# Patient Record
Sex: Male | Born: 2000 | Hispanic: Yes | Marital: Single | Smoking: Never smoker
Health system: Southern US, Community
[De-identification: ages and names within clinical notes are randomized; demographics above are authoritative.]

## PROBLEM LIST (undated history)

## (undated) DIAGNOSIS — R040 Epistaxis: Secondary | ICD-10-CM

## (undated) HISTORY — DX: Epistaxis: R04.0

---
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
## 2019-03-22 NOTE — Progress Notes (Signed)
Adolescent Well Care Visit Curtis Frost is a 18 y.o. male who is here for well care.    PCP:  Madelin HeadingsPanosh, Sarahi Borland K, MD   History was provided by the patient.  Current Issues: Current concerns include .   Non immuniz  Document or college  Asks a bout premature graying in family  Nutrition: Nutrition/Eating Behaviors:   Adequate calcium in diet?:poss not  Supplements/ Vitamins: no  Exercise/ Media: Play any Sports?/ Exercise: soccer  Screen Time:  Media Rules o 5 hours  r Monitoring?: yes   Sleep:  Sleep:7   Social Screening: Lives with:  Mom  Father in  immigration limbo  Still  Parental relations:  good Activities, Work, and Regulatory affairs officerChores?:  Concerns regarding behavior with peers?  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   Confidential Social History: Tobacco?  no tobacco  Secondhand smoke exposure?  no no Drugs/ETOH?  no  Safe at home, in school & in relationships?  Yes Safe to self?  Yes   Screenings: Patient has a dental home: yes  PHQ-  No depression per patient  Physical Exam:  Vitals:   03/25/19 1454  BP: 118/68  Pulse: 91  Temp: 98.1 F (36.7 C)  TempSrc: Oral  SpO2: 95%  Weight: 126 lb 3.2 oz (57.2 kg)  Height: 5' 4.5" (1.638 m)   BP 118/68 (BP Location: Left Arm, Patient Position: Sitting, Cuff Size: Normal)   Pulse 91   Temp 98.1 F (36.7 C) (Oral)   Ht 5' 4.5" (1.638 m)   Wt 126 lb 3.2 oz (57.2 kg)   SpO2 95%   BMI 21.33 kg/m  Body mass index: body mass index is 21.33 kg/m. Blood pressure percentiles are not available for patients who are 18 years or older.  Physical Exam Well-developed well-nourished healthy-appearing appears stated age in no acute distress.  HEENT: Normocephalic  TMs clear  Nl lm  EACs  Eyes RR x2 EOMs appear normal  nares patent OP deferred  Masking Neck: supple without adenopathy Chest :clear to auscultation breath sounds equal no wheezes rales or rhonchi Cardiovascular :PMI nondisplaced S1-S2 no gallops or murmurs peripheral pulses present without delay Abdomen :soft without organomegaly guarding or rebound Lymph nodes :no significant adenopathy neck axillary inguinal Extremities: no acute deformities normal range of motion no acute swelling Gait within normal limits Spine without scoliosis Neurologic: grossly nonfocal normal tone cranial nerves appear intact. Skin: no acute rashes Screening ortho / MS exam: normal;  No scoliosis ,LOM , joint swelling or gait disturbance . Muscle mass is normal .   Lab Results  Component Value Date   WBC 6.1 03/14/2018   HGB 15.9 03/14/2018   HCT 45.7 03/14/2018   PLT 234.0 03/14/2018   GLUCOSE 88 03/14/2018   CHOL 167 03/14/2018   TRIG 158.0 (H) 03/14/2018   HDL 39.10 03/14/2018   LDLCALC 97 03/14/2018   ALT 21 03/14/2018   AST 19 03/14/2018   NA 140 03/14/2018   K 4.4 03/14/2018   CL 102 03/14/2018   CREATININE 1.07 03/14/2018   BUN 10 03/14/2018   CO2 31 03/14/2018   TSH 1.80 03/14/2018     Assessment and Plan:     ICD-10-CM   1. Visit for preventive health examination  Z00.00 Meningococcal MCV4O(Menveo)  2. Need for meningococcal vaccination  Z23 Meningococcal B, OMV   BMI is appropriate for age  Hearing screening result:not examined Vision screening result: not examined reviewed  Growth  parameters Counseling provided for all of the vaccine components  Orders Placed This Encounter  Procedures  . Meningococcal MCV4O(Menveo)  . Meningococcal B, OMV   Return in about 1 year (around 03/24/2020) for cpx .Marland Kitchen  Shanon Ace, MD

## 2019-03-25 ENCOUNTER — Encounter: Payer: Self-pay | Admitting: Internal Medicine

## 2019-03-25 ENCOUNTER — Other Ambulatory Visit: Payer: Self-pay

## 2019-03-25 ENCOUNTER — Ambulatory Visit (INDEPENDENT_AMBULATORY_CARE_PROVIDER_SITE_OTHER): Payer: Managed Care, Other (non HMO) | Admitting: Internal Medicine

## 2019-03-25 VITALS — BP 118/68 | HR 91 | Temp 98.1°F | Ht 64.5 in | Wt 126.2 lb

## 2019-03-25 DIAGNOSIS — Z23 Encounter for immunization: Secondary | ICD-10-CM | POA: Diagnosis not present

## 2019-03-25 DIAGNOSIS — Z0000 Encounter for general adult medical examination without abnormal findings: Secondary | ICD-10-CM | POA: Diagnosis not present

## 2019-03-25 NOTE — Patient Instructions (Signed)
Good luck in college.  Getting menveo 2 and meningitis B 2 today   Advise yearly flu vaccine  Next tdap due in 2023    Health Maintenance, Male Adopting a healthy lifestyle and getting preventive care are important in promoting health and wellness. Ask your health care provider about:  The right schedule for you to have regular tests and exams.  Things you can do on your own to prevent diseases and keep yourself healthy. What should I know about diet, weight, and exercise? Eat a healthy diet   Eat a diet that includes plenty of vegetables, fruits, low-fat dairy products, and lean protein.  Do not eat a lot of foods that are high in solid fats, added sugars, or sodium. Maintain a healthy weight Body mass index (BMI) is a measurement that can be used to identify possible weight problems. It estimates body fat based on height and weight. Your health care provider can help determine your BMI and help you achieve or maintain a healthy weight. Get regular exercise Get regular exercise. This is one of the most important things you can do for your health. Most adults should:  Exercise for at least 150 minutes each week. The exercise should increase your heart rate and make you sweat (moderate-intensity exercise).  Do strengthening exercises at least twice a week. This is in addition to the moderate-intensity exercise.  Spend less time sitting. Even light physical activity can be beneficial. Watch cholesterol and blood lipids Have your blood tested for lipids and cholesterol at 19 years of age, then have this test every 5 years. You may need to have your cholesterol levels checked more often if:  Your lipid or cholesterol levels are high.  You are older than 19 years of age.  You are at high risk for heart disease. What should I know about cancer screening? Many types of cancers can be detected early and may often be prevented. Depending on your health history and family history, you  may need to have cancer screening at various ages. This may include screening for:  Colorectal cancer.  Prostate cancer.  Skin cancer.  Lung cancer. What should I know about heart disease, diabetes, and high blood pressure? Blood pressure and heart disease  High blood pressure causes heart disease and increases the risk of stroke. This is more likely to develop in people who have high blood pressure readings, are of African descent, or are overweight.  Talk with your health care provider about your target blood pressure readings.  Have your blood pressure checked: ? Every 3-5 years if you are 2318-19 years of age. ? Every year if you are 19 years old or older.  If you are between the ages of 6865 and 4675 and are a current or former smoker, ask your health care provider if you should have a one-time screening for abdominal aortic aneurysm (AAA). Diabetes Have regular diabetes screenings. This checks your fasting blood sugar level. Have the screening done:  Once every three years after age 19 if you are at a normal weight and have a low risk for diabetes.  More often and at a younger age if you are overweight or have a high risk for diabetes. What should I know about preventing infection? Hepatitis B If you have a higher risk for hepatitis B, you should be screened for this virus. Talk with your health care provider to find out if you are at risk for hepatitis B infection. Hepatitis C Blood testing is recommended  for:  Everyone born from [birth year] through 1966.  Anyone with known risk factors for hepatitis C. Sexually transmitted infections (STIs)  You should be screened each year for STIs, including gonorrhea and chlamydia, if: ? You are sexually active and are younger than 19 years of age. ? You are older than 19 years of age and your health care provider tells you that you are at risk for this type of infection. ? Your sexual activity has changed since you were last screened, and you  are at increased risk for chlamydia or gonorrhea. Ask your health care provider if you are at risk.  Ask your health care provider about whether you are at high risk for HIV. Your health care provider may recommend a prescription medicine to help prevent HIV infection. If you choose to take medicine to prevent HIV, you should first get tested for HIV. You should then be tested every 3 months for as long as you are taking the medicine. Follow these instructions at home: Lifestyle  Do not use any products that contain nicotine or tobacco, such as cigarettes, e-cigarettes, and chewing tobacco. If you need help quitting, ask your health care provider.  Do not use street drugs.  Do not share needles.  Ask your health care provider for help if you need support or information about quitting drugs. Alcohol use  Do not drink alcohol if your health care provider tells you not to drink.  If you drink alcohol: ? Limit how much you have to 0-2 drinks a day. ? Be aware of how much alcohol is in your drink. In the [place] one drink equals one 12 oz bottle of beer (355 mL), one 5 oz glass of wine (148 mL), or one 1 oz glass of hard liquor (44 mL). General instructions  Schedule regular health, dental, and eye exams.  Stay current with your vaccines.  Tell your health care provider if: ? You often feel depressed. ? You have ever been abused or do not feel safe at home. Summary  Adopting a healthy lifestyle and getting preventive care are important in promoting health and wellness.  Follow your health care provider's instructions about healthy diet, exercising, and getting tested or screened for diseases.  Follow your health care provider's instructions on monitoring your cholesterol and blood pressure. This information is not intended to replace advice given to you by your health care provider. Make sure you discuss any questions you have with your health care provider. Document Released:  02/11/2008 Document Revised: 08/08/2018 Document Reviewed: 08/08/2018 Elsevier Patient Education  2020 [employer].

## 2020-02-27 NOTE — Progress Notes (Signed)
Chief Complaint  Patient presents with  . Annual Exam    Doing okay    HPI: Patient  Curtis Frost  19 y.o. comes in today for [place] visit  Going to [employer] college in the fall ( had one semester in past)  Has been owrkin on line at home   [place] a year ago  Major cyber security  commuter Exercising   Pick up soccer .  Health Maintenance  Topic Date Due  . Hepatitis C Screening  Never done  . INFLUENZA VACCINE  03/29/2020  . TETANUS/TDAP  03/30/2022  . COVID-19 Vaccine  Completed  . HIV Screening  Completed   Health Maintenance Review LIFESTYLE:  Exercise:  Yes reg Tobacco/ETS:n Alcohol: n Sugar beverages: soda  On weekends.  Sleep: about 7 hours + Drug use: no HH of  2 +  Pet s Work:/school   [employer] rising fr  No major injury   ROS:  GEN/ HEENT: No fever, significant weight changes sweats headaches vision problems hearing changes, CV/ PULM; No chest pain shortness of breath cough, syncope,edema  change in exercise tolerance. GI /GU: No adominal pain, vomiting, change in bowel habits. No blood in the stool. No significant GU symptoms. SKIN/HEME: ,no acute skin rashes suspicious lesions or bleeding. No lymphadenopathy, nodules, masses.  NEURO/ PSYCH:  No neurologic signs such as weakness numbness. No depression anxiety. IMM/ Allergy: No unusual infections.  Allergy .   REST of 12 system review negative except as per HPI   Past Medical History:  Diagnosis Date  . Epistaxis 04/20/2010   Qualifier: Diagnosis of  By: Regis Bill MD, Standley Brooking     History reviewed. No pertinent surgical history.  Family History  Problem Relation Age of Onset  . Diabetes Other        both grandparents generations  . Hyperlipidemia Mother        rx with diet    Social History   Socioeconomic History  . Marital status: Single    Spouse name: Not on file  . Number of children: Not on file  . Years of education: Not on file  . Highest  education level: Not on file  Occupational History  . Not on file  Tobacco Use  . Smoking status: Never Smoker  . Smokeless tobacco: Never Used  Vaping Use  . Vaping Use: Never used  Substance and Sexual Activity  . Alcohol use: No  . Drug use: No  . Sexual activity: Not Currently  Other Topics Concern  . Not on file  Social History Narrative   Elem  [employer] in school middle school  [employer]  To [employer]    Neg tad   Soccer.     HH of 3    No pets     Social Determinants of Radio broadcast assistant Strain:   . Difficulty of Paying Living Expenses:   Food Insecurity:   . Worried About Charity fundraiser in the Last Year:   . Arboriculturist in the Last Year:   Transportation Needs:   . Film/video editor (Medical):   Marland Kitchen Lack of Transportation (Non-Medical):   Physical Activity:   . Days of Exercise per Week:   . Minutes of Exercise per Session:   Stress:   . Feeling of Stress :   Social Connections:   . Frequency of Communication with Friends and Family:   . Frequency of  Social Gatherings with Friends and Family:   . Attends Religious Services:   . Active Member of Clubs or Organizations:   . Attends Archivist Meetings:   Marland Kitchen Marital Status:     No outpatient medications prior to visit.   No facility-administered medications prior to visit.     EXAM:  BP 110/70   Pulse 63   Temp 98.2 F (36.8 C) (Temporal)   Ht '5\' 5"'  (1.651 m)   Wt 127 lb 12.8 oz (58 kg)   SpO2 98%   BMI 21.27 kg/m   Body mass index is 21.27 kg/m. Wt Readings from Last 3 Encounters:  02/28/20 127 lb 12.8 oz (58 kg) (10 %, Z= -1.30)*  03/25/19 126 lb 3.2 oz (57.2 kg) (11 %, Z= -1.24)*  03/02/18 123 lb (55.8 kg) (12 %, Z= -1.16)*   * Growth percentiles are based on CDC (Boys, 2-20 Years) data.    Physical Exam: Vital signs reviewed DVV:OHYW is a well-developed well-nourished alert cooperative    who appearsr stated age in no acute distress.    HEENT: normocephalic atraumatic , Eyes: PERRL EOM's full, conjunctiva clear, Nares: paten,t no deformity discharge or tenderness., Ears: no deformity EAC's 1+ wax reight ear tm nl TMs with normal landmarks. Mouth: masked NECK: supple without masses, thyromegaly or bruits. CHEST/PULM:  Clear to auscultation and percussion breath sounds equal no wheeze , rales or rhonchi. No chest wall deformities or tenderness. CV: PMI is nondisplaced, S1 S2 no gallops, murmurs, rubs. Peripheral pulses are full without delay.No JVD .  ABDOMEN: Bowel sounds normal nontender  No guard or rebound, no hepato splenomegal no CVA tenderness.   Extremtities:  No clubbing cyanosis or edema, no acute joint swelling or redness no focal atrophy NEURO:  Oriented x3, cranial nerves 3-12 appear to be intact, no obvious focal weakness,gait within normal limits no abnormal reflexes or asymmetrical SKIN: No acute rashes normal turgor, color, no bruising or petechiae. PSYCH: Oriented, good eye contact, no obvious depression anxiety, cognition and judgment appear normal. LN: no cervical axillary inguinal adenopathy  Lab Results  Component Value Date   WBC 6.1 03/14/2018   HGB 15.9 03/14/2018   HCT 45.7 03/14/2018   PLT 234.0 03/14/2018   GLUCOSE 88 03/14/2018   CHOL 167 03/14/2018   TRIG 158.0 (H) 03/14/2018   HDL 39.10 03/14/2018   LDLCALC 97 03/14/2018   ALT 21 03/14/2018   AST 19 03/14/2018   NA 140 03/14/2018   K 4.4 03/14/2018   CL 102 03/14/2018   CREATININE 1.07 03/14/2018   BUN 10 03/14/2018   CO2 31 03/14/2018   TSH 1.80 03/14/2018    BP Readings from Last 3 Encounters:  02/28/20 110/70  03/25/19 118/68  03/02/18 (!) 108/64 (28 %, Z = -0.59 /  43 %, Z = -0.19)*   *BP percentiles are based on the 2017 AAP Clinical Practice Guideline for boys    Past  Lab results reviewed with patient  Slight elevation of tg    Reviewed physical parameters   ASSESSMENT AND PLAN:  Discussed the following assessment  and plan:    ICD-10-CM   1. Visit for preventive health examination  Z00.00    Return in about 1 year (around 02/27/2021) for wellness visit. Shared Decision Making  updated lab  declined sti screen no need    Agree with getting lipid panel  BG screen  etc in  About 5 years from past  Continue lifestyle intervention healthy eating and  exercise .  Immuniz utd  And copies given If requires form in next year  Ok   sugg sign up for my chart  Patient Care Team: Aslin Farinas, Standley Brooking, MD as PCP - General Patient Instructions  Glad you are well  Continue lifestyle intervention healthy eating and exercise .   Get lipid panel checked   In  In 2023 0 2024 or as needed     Preventive Care 65-34 Years Old, Male Preventive care refers to lifestyle choices and visits with your health care provider that can promote health and wellness. At this stage in your life, you may start seeing a primary care physician instead of a pediatrician. Your health care is now your responsibility. Preventive care for young adults includes:  A yearly physical exam. This is also called an annual wellness visit.  Regular dental and eye exams.  Immunizations.  Screening for certain conditions.  Healthy lifestyle choices, such as diet and exercise. What can I expect for my preventive care visit? Physical exam Your health care provider may check:  Height and weight. These may be used to calculate body mass index (BMI), which is a measurement that tells if you are at a healthy weight.  Heart rate and blood pressure.  Body temperature. Counseling Your health care provider may ask you questions about:  Past medical problems and family medical history.  Alcohol, tobacco, and drug use.  Home and relationship well-being.  Access to firearms.  Emotional well-being.  Diet, exercise, and sleep habits.  Sexual activity and sexual health. What immunizations do I need?  Influenza (flu) vaccine  This is  recommended every year. Tetanus, diphtheria, and pertussis (Tdap) vaccine  You may need a Td booster every 10 years. Varicella (chickenpox) vaccine  You may need this vaccine if you have not already been vaccinated. Human papillomavirus (HPV) vaccine  If recommended by your health care provider, you may need three doses over 6 months. Measles, mumps, and rubella (MMR) vaccine  You may need at least one dose of MMR. You may also need a second dose. Meningococcal conjugate (MenACWY) vaccine  One dose is recommended if you are 71-75 years old and a Market researcher living in a residence hall, or if you have one of several medical conditions. You may also need additional booster doses. Pneumococcal conjugate (PCV13) vaccine  You may need this if you have certain conditions and were not previously vaccinated. Pneumococcal polysaccharide (PPSV23) vaccine  You may need one or two doses if you smoke cigarettes or if you have certain conditions. Hepatitis A vaccine  You may need this if you have certain conditions or if you travel or work in places where you may be exposed to hepatitis A. Hepatitis B vaccine  You may need this if you have certain conditions or if you travel or work in places where you may be exposed to hepatitis B. Haemophilus influenzae type b (Hib) vaccine  You may need this if you have certain risk factors. You may receive vaccines as individual doses or as more than one vaccine together in one shot (combination vaccines). Talk with your health care provider about the risks and benefits of combination vaccines. What tests do I need? Blood tests  Lipid and cholesterol levels. These may be checked every 5 years starting at age 20.  Hepatitis C test.  Hepatitis B test. Screening  Genital exam to check for testicular cancer or hernias.  Sexually transmitted disease (STD) testing, if you are at risk.  Other tests  Tuberculosis skin test.  Vision and  hearing tests.  Skin exam. Follow these instructions at home: Eating and drinking   Eat a diet that includes fresh fruits and vegetables, whole grains, lean protein, and low-fat dairy products.  Drink enough fluid to keep your urine pale yellow.  Do not drink alcohol if: ? Your health care provider tells you not to drink. ? You are under the legal drinking age. In the [place] the legal drinking age is 80.  If you drink alcohol: ? Limit how much you have to 0-2 drinks a day. ? Be aware of how much alcohol is in your drink. In the [place] one drink equals one 12 oz bottle of beer (355 mL), one 5 oz glass of wine (148 mL), or one 1 oz glass of hard liquor (44 mL). Lifestyle  Take daily care of your teeth and gums.  Stay active. Exercise at least 30 minutes 5 or more days of the week.  Do not use any products that contain nicotine or tobacco, such as cigarettes, e-cigarettes, and chewing tobacco. If you need help quitting, ask your health care provider.  Do not use drugs.  If you are sexually active, practice safe sex. Use a condom or other form of protection to prevent STIs (sexually transmitted infections).  Find healthy ways to cope with stress, such as: ? Meditation, yoga, or listening to music. ? Journaling. ? Talking to a trusted person. ? Spending time with friends and family. Safety  Always wear your seat belt while driving or riding in a vehicle.  Do not drive if you have been drinking alcohol.  Do not ride with someone who has been drinking.  Do not drive when you are tired or distracted.  Do not text while driving.  Wear a helmet and other protective equipment during sports activities.  If you have firearms in your house, make sure you follow all gun safety procedures.  Seek help if you have been bullied, physically abused, or sexually abused.  Use the Internet responsibly to avoid dangers such as online bullying and online sex predators. What's next?  Go  to your health care provider once a year for a well check visit.  Ask your health care provider how often you should have your eyes and teeth checked.  Stay up to date on all vaccines. This information is not intended to replace advice given to you by your health care provider. Make sure you discuss any questions you have with your health care provider. Document Revised: 08/09/2018 Document Reviewed: 08/09/2018 Elsevier Patient Education  2020 Graham Johnnell Liou M.D.

## 2020-02-28 ENCOUNTER — Other Ambulatory Visit: Payer: Self-pay

## 2020-02-28 ENCOUNTER — Encounter: Payer: Self-pay | Admitting: Internal Medicine

## 2020-02-28 ENCOUNTER — Ambulatory Visit (INDEPENDENT_AMBULATORY_CARE_PROVIDER_SITE_OTHER): Payer: Managed Care, Other (non HMO) | Admitting: Internal Medicine

## 2020-02-28 VITALS — BP 110/70 | HR 63 | Temp 98.2°F | Ht 65.0 in | Wt 127.8 lb

## 2020-02-28 DIAGNOSIS — Z0000 Encounter for general adult medical examination without abnormal findings: Secondary | ICD-10-CM

## 2020-02-28 NOTE — Patient Instructions (Addendum)
Glad you are well  Continue lifestyle intervention healthy eating and exercise .   Get lipid panel checked   In  In 2023 0 2024 or as needed     Preventive Care 22-20 Years Old, Male Preventive care refers to lifestyle choices and visits with your health care provider that can promote health and wellness. At this stage in your life, you may start seeing a primary care physician instead of a pediatrician. Your health care is now your responsibility. Preventive care for young adults includes:  A yearly physical exam. This is also called an annual wellness visit.  Regular dental and eye exams.  Immunizations.  Screening for certain conditions.  Healthy lifestyle choices, such as diet and exercise. What can I expect for my preventive care visit? Physical exam Your health care provider may check:  Height and weight. These may be used to calculate body mass index (BMI), which is a measurement that tells if you are at a healthy weight.  Heart rate and blood pressure.  Body temperature. Counseling Your health care provider may ask you questions about:  Past medical problems and family medical history.  Alcohol, tobacco, and drug use.  Home and relationship well-being.  Access to firearms.  Emotional well-being.  Diet, exercise, and sleep habits.  Sexual activity and sexual health. What immunizations do I need?  Influenza (flu) vaccine  This is recommended every year. Tetanus, diphtheria, and pertussis (Tdap) vaccine  You may need a Td booster every 10 years. Varicella (chickenpox) vaccine  You may need this vaccine if you have not already been vaccinated. Human papillomavirus (HPV) vaccine  If recommended by your health care provider, you may need three doses over 6 months. Measles, mumps, and rubella (MMR) vaccine  You may need at least one dose of MMR. You may also need a second dose. Meningococcal conjugate (MenACWY) vaccine  One dose is recommended if you  are 52-20 years old and a Market researcher living in a residence hall, or if you have one of several medical conditions. You may also need additional booster doses. Pneumococcal conjugate (PCV13) vaccine  You may need this if you have certain conditions and were not previously vaccinated. Pneumococcal polysaccharide (PPSV23) vaccine  You may need one or two doses if you smoke cigarettes or if you have certain conditions. Hepatitis A vaccine  You may need this if you have certain conditions or if you travel or work in places where you may be exposed to hepatitis A. Hepatitis B vaccine  You may need this if you have certain conditions or if you travel or work in places where you may be exposed to hepatitis B. Haemophilus influenzae type b (Hib) vaccine  You may need this if you have certain risk factors. You may receive vaccines as individual doses or as more than one vaccine together in one shot (combination vaccines). Talk with your health care provider about the risks and benefits of combination vaccines. What tests do I need? Blood tests  Lipid and cholesterol levels. These may be checked every 5 years starting at age 94.  Hepatitis C test.  Hepatitis B test. Screening  Genital exam to check for testicular cancer or hernias.  Sexually transmitted disease (STD) testing, if you are at risk. Other tests  Tuberculosis skin test.  Vision and hearing tests.  Skin exam. Follow these instructions at home: Eating and drinking   Eat a diet that includes fresh fruits and vegetables, whole grains, lean protein, and low-fat dairy  products.  Drink enough fluid to keep your urine pale yellow.  Do not drink alcohol if: ? Your health care provider tells you not to drink. ? You are under the legal drinking age. In the [place] the legal drinking age is 40.  If you drink alcohol: ? Limit how much you have to 0-2 drinks a day. ? Be aware of how much alcohol is in your drink.  In the [place] one drink equals one 12 oz bottle of beer (355 mL), one 5 oz glass of wine (148 mL), or one 1 oz glass of hard liquor (44 mL). Lifestyle  Take daily care of your teeth and gums.  Stay active. Exercise at least 30 minutes 5 or more days of the week.  Do not use any products that contain nicotine or tobacco, such as cigarettes, e-cigarettes, and chewing tobacco. If you need help quitting, ask your health care provider.  Do not use drugs.  If you are sexually active, practice safe sex. Use a condom or other form of protection to prevent STIs (sexually transmitted infections).  Find healthy ways to cope with stress, such as: ? Meditation, yoga, or listening to music. ? Journaling. ? Talking to a trusted person. ? Spending time with friends and family. Safety  Always wear your seat belt while driving or riding in a vehicle.  Do not drive if you have been drinking alcohol.  Do not ride with someone who has been drinking.  Do not drive when you are tired or distracted.  Do not text while driving.  Wear a helmet and other protective equipment during sports activities.  If you have firearms in your house, make sure you follow all gun safety procedures.  Seek help if you have been bullied, physically abused, or sexually abused.  Use the Internet responsibly to avoid dangers such as online bullying and online sex predators. What's next?  Go to your health care provider once a year for a well check visit.  Ask your health care provider how often you should have your eyes and teeth checked.  Stay up to date on all vaccines. This information is not intended to replace advice given to you by your health care provider. Make sure you discuss any questions you have with your health care provider. Document Revised: 08/09/2018 Document Reviewed: 08/09/2018 Elsevier Patient Education  2020 [employer].

## 2021-02-22 ENCOUNTER — Emergency Department (HOSPITAL_BASED_OUTPATIENT_CLINIC_OR_DEPARTMENT_OTHER): Payer: PRIVATE HEALTH INSURANCE

## 2021-02-22 ENCOUNTER — Emergency Department (HOSPITAL_BASED_OUTPATIENT_CLINIC_OR_DEPARTMENT_OTHER)
Admission: EM | Admit: 2021-02-22 | Discharge: 2021-02-22 | Disposition: A | Discharge status: Home / Self Care | Payer: PRIVATE HEALTH INSURANCE | Attending: Emergency Medicine | Admitting: Emergency Medicine

## 2021-02-22 ENCOUNTER — Emergency Department (HOSPITAL_BASED_OUTPATIENT_CLINIC_OR_DEPARTMENT_OTHER): Payer: PRIVATE HEALTH INSURANCE | Admitting: Radiology

## 2021-02-22 ENCOUNTER — Encounter (HOSPITAL_BASED_OUTPATIENT_CLINIC_OR_DEPARTMENT_OTHER): Payer: Self-pay

## 2021-02-22 ENCOUNTER — Other Ambulatory Visit: Payer: Self-pay

## 2021-02-22 DIAGNOSIS — M25562 Pain in left knee: Secondary | ICD-10-CM | POA: Insufficient documentation

## 2021-02-22 DIAGNOSIS — M25561 Pain in right knee: Secondary | ICD-10-CM | POA: Insufficient documentation

## 2021-02-22 DIAGNOSIS — M545 Low back pain, unspecified: Secondary | ICD-10-CM | POA: Diagnosis not present

## 2021-02-22 DIAGNOSIS — Y92410 Unspecified street and highway as the place of occurrence of the external cause: Secondary | ICD-10-CM | POA: Insufficient documentation

## 2021-02-22 MED ORDER — IBUPROFEN 400 MG PO TABS
600.0000 mg | ORAL_TABLET | Freq: Once | ORAL | Status: AC
  Administered 2021-02-22: 600 mg via ORAL
  Filled 2021-02-22: qty 1

## 2021-02-22 MED ORDER — ACETAMINOPHEN 325 MG PO TABS
650.0000 mg | ORAL_TABLET | Freq: Once | ORAL | Status: AC
  Administered 2021-02-22: 650 mg via ORAL
  Filled 2021-02-22: qty 2

## 2021-02-22 NOTE — Discharge Instructions (Addendum)
Call your primary care doctor or specialist as discussed in the next 2-3 days.   Return immediately back to the ER if:  Your symptoms worsen within the next 12-24 hours. You develop new symptoms such as new fevers, persistent vomiting, new pain, shortness of breath, or new weakness or numbness, or if you have any other concerns.  
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
## 2021-04-12 ENCOUNTER — Other Ambulatory Visit: Payer: Self-pay

## 2021-04-12 NOTE — Progress Notes (Signed)
Chief Complaint  Patient presents with   Annual Exam     HPI: Patient  Curtis Frost  20 y.o. comes in today for Preventive Health Care visit   Concern : Had swollen lymph nodes in neck and seen at [facility] and given antibiotic . ? Amoxicillin  Still there .  May have been  4 weeks ago.  Better but persists   no fever  systemic sx  had neg strep and covid testing . May have had a spot on tonsil at the time on the right  Health Maintenance  Topic Date Due   Hepatitis C Screening  Never done   INFLUENZA VACCINE  03/29/2021   TETANUS/TDAP  03/30/2022   HPV VACCINES  Completed   COVID-19 Vaccine  Completed   HIV Screening  Completed   Pneumococcal Vaccine 81-69 Years old  Aged Out   Health Maintenance Review LIFESTYLE:  Exercise:  active every day .   Goes to gym Tobacco/ETS: n Alcohol:  Sugar beverages: Sleep:  8 hours+ Drug use: no HH of  at home Work: [employer]  summer   back to school. Junior . 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 No pertinent surgical history.  Family History  Problem Relation Age of Onset   Diabetes Other        both grandparents generations   Hyperlipidemia Mother        rx with diet    Social History   Socioeconomic History   Marital status: Single    Spouse name: Not on file   Number of children: Not on file   Years of education: Not on file   Highest education level: Not on file  Occupational History   Not on file  Tobacco Use   Smoking status: Never   Smokeless tobacco: Never  Vaping Use   Vaping Use: Never used  Substance and Sexual Activity   Alcohol use: No   Drug use: No   Sexual activity: Not Currently  Other Topics Concern   Not on file  Social History Narrative   Elem  [employer] in [employer]  [employer]  To [employer]     Neg tad   Soccer.     HH of 3    No pets     Social Determinants of Corporate investment banker Strain: Not on file  Food Insecurity: Not on file  Transportation Needs: Not on file  Physical Activity: Not on file  Stress: Not on file  Social Connections: Not on file    No outpatient medications prior to visit.   No facility-administered medications prior to visit.     EXAM:  BP 112/70 (BP Location: Left Arm, Patient Position: Sitting, Cuff Size: Normal)   Pulse 82   Temp 98.1 F (36.7 C) (Oral)   Ht 5\' 6"  (1.676 m)   Wt 137 lb (62.1 kg)   SpO2 98%   BMI 22.11 kg/m   Body mass index is 22.11 kg/m. Wt Readings from Last 3 Encounters:  04/13/21 137 lb (62.1 kg)  02/22/21 140 lb (63.5 kg)  02/28/20 127 lb 12.8 oz (58 kg) (10 %, Z= -1.30)*   * Growth percentiles are based on CDC (Boys, 2-20 Years) data.  Physical Exam: Vital signs reviewed PYP:PJKD is a well-developed well-nourished alert cooperative    who appearsr stated age in no acute distress.  HEENT: normocephalic atraumatic , Eyes: PERRL EOM's full, conjunctiva clear, Nares: paten,t no deformity discharge or tenderness., Ears: no deformity EAC's clear TMs with normal landmarks. Mouth  OP tonsil right 2+ left 1+ red no edema no exudate  NECK: supple without thyromegaly or bruits. 1 + ac jdg mobile nodes no pc nodes  non tender  CHEST/PULM:  Clear to auscultation and percussion breath sounds equal no wheeze , rales or rhonchi. No chest wall deformities or tenderness. CV: PMI is nondisplaced, S1 S2 no gallops, murmurs, rubs. Peripheral pulses are full without delay.No JVD .  ABDOMEN: Bowel sounds normal nontender  No guard or rebound, no hepato splenomegal no CVA tenderness.   Extremtities:  No clubbing cyanosis or edema, no acute joint swelling or redness no focal atrophy NEURO:  Oriented x3, cranial nerves 3-12 appear to be intact, no obvious focal weakness,gait within normal limits no abnormal reflexes or  asymmetrical SKIN: No acute rashes normal turgor, color, no bruising or petechiae. PSYCH: Oriented, good eye contact, no obvious depression anxiety, cognition and judgment appear normal. LN: no posterior adenopathy  axillary inguinal adenopathy  Lab Results  Component Value Date   WBC 5.4 04/13/2021   HGB 15.1 04/13/2021   HCT 43.5 04/13/2021   PLT 220.0 04/13/2021   GLUCOSE 87 04/13/2021   CHOL 181 04/13/2021   TRIG 120.0 04/13/2021   HDL 40.80 04/13/2021   LDLCALC 117 (H) 04/13/2021   ALT 31 04/13/2021   AST 19 04/13/2021   NA 137 04/13/2021   K 4.0 04/13/2021   CL 101 04/13/2021   CREATININE 1.09 04/13/2021   BUN 14 04/13/2021   CO2 30 04/13/2021   TSH 1.80 03/14/2018    BP Readings from Last 3 Encounters:  04/13/21 112/70  02/22/21 120/70  02/28/20 110/70    Lab plan reviewed with patient  Declines  sti screen not high risk  ASSESSMENT AND PLAN:  Discussed the following assessment and plan:    ICD-10-CM   1. Visit for preventive health examination  Z00.00 Basic metabolic panel    CBC with Differential/Platelet    Hepatic function panel    Lipid panel    Epstein-Barr virus VCA antibody panel    C-reactive protein    C-reactive protein    Epstein-Barr virus VCA antibody panel    Lipid panel    Hepatic function panel    CBC with Differential/Platelet    Basic metabolic panel    2. Adenopathy, cervical  R59.0 Basic metabolic panel    CBC with Differential/Platelet    Hepatic function panel    Lipid panel    Epstein-Barr virus VCA antibody panel    C-reactive protein    C-reactive protein    Epstein-Barr virus VCA antibody panel    Lipid panel    Hepatic function panel    CBC with Differential/Platelet    Basic metabolic panel    3. Tonsillar enlargement  J35.1 Basic metabolic panel    CBC with Differential/Platelet    Hepatic function panel    Lipid panel    Epstein-Barr virus VCA antibody panel    C-reactive protein    C-reactive protein     Epstein-Barr virus VCA antibody panel    Lipid panel    Hepatic function panel    CBC with Differential/Platelet    Basic metabolic panel  Suspect partly treated tonsillitis although asymmetrical right more than left rule out mono consider another course of broader spectrum antibiotic follow-up ENT if needed.  Do not suspect systemic disease otherwise.  Doubt other adenopathy and no hepatosplenomegaly.  Return for depending on results and how doing.  Patient Care Team: Madelin Headings, MD as PCP - General Patient Instructions  Good to see you today  Suspect that you have a partly treated  tonsillitis   that causing the swollen glands   Checking for MONO a virus ( EBV) that  can cause sx for weeks before  resolves on its own.   We may add another course  of antibiotics   and fu if not better .  Your exam is reassuring  and no other signs of  enlarged  lymph glands .  Continue lifestyle intervention healthy eating and exercise .   Preventive Care 65-4 Years Old, Male Preventive care refers to lifestyle choices and visits with your health care provider that can promote health and wellness. At this stage in your life, you may start seeing a primary care physician instead of a pediatrician. It is important to take responsibility for your health and well-being. Preventive care for young adults includes: A yearly physical exam. This is also called an annual wellness visit. Regular dental and eye exams. Immunizations. Screening for certain conditions. Healthy lifestyle choices, such as: Eating a healthy diet. Getting regular exercise. Not using drugs or products that contain nicotine and tobacco. Limiting alcohol use. What can I expect for my preventive care visit? Physical exam Your health care provider may check your: Height and weight. These may be used to calculate your BMI (body mass index). BMI is a measurement that tells if you are at a healthy weight. Heart rate and blood  pressure. Body temperature. Skin for abnormal spots. Counseling Your health care provider may ask you questions about your: Past medical problems. Family's medical history. Alcohol, tobacco, and drug use. Home life and relationship well-being. Access to firearms. Emotional well-being. Diet, exercise, and sleep habits. Sexual activity and sexual health. What immunizations do I need?  Vaccines are usually given at various ages, according to a schedule. Your health care provider will recommend vaccines for you based on your age, medicalhistory, and lifestyle or other factors, such as travel or where you work. What tests do I need? Blood tests Lipid and cholesterol levels. These may be checked every 5 years starting at age 40. Hepatitis C test. Hepatitis B test. Screening Genital exam to check for testicular cancer or hernias. STD (sexually transmitted disease) testing, if you are at risk. Other tests Tuberculosis skin test. Vision and hearing tests. Skin exam. Talk with your health care provider about your test results, treatment options,and if necessary, the need for more tests. Follow these instructions at home: Eating and drinking  Eat a healthy diet that includes fresh fruits and vegetables, whole grains, lean protein, and low-fat dairy products. Drink enough fluid to keep your urine pale yellow. Do not drink alcohol if: Your health care provider tells you not to drink. You are under the legal drinking age. In the [place] the legal drinking age is 93. If you drink alcohol: Limit how much you use to 0-2 drinks a day. Be aware of how much alcohol is in your drink. In the [place] one drink equals one 12 oz bottle of beer (355 mL), one 5 oz glass of wine (148 mL), or one 1 oz glass of hard liquor (44  mL).  Lifestyle Take daily care of your teeth and gums. Brush your teeth every morning and night with fluoride toothpaste. Floss one time each day. Stay active. Exercise for at least  30 minutes 5 or more days of the week. Do not use any products that contain nicotine or tobacco, such as cigarettes, e-cigarettes, and chewing tobacco. If you need help quitting, ask your health care provider. Do not use drugs. If you are sexually active, practice safe sex. Use a condom or other form of protection to prevent STIs (sexually transmitted infections). Find healthy ways to cope with stress, such as: Meditation, yoga, or listening to music. Journaling. Talking to a trusted person. Spending time with friends and family. Safety Always wear your seat belt while driving or riding in a vehicle. Do not drive: If you have been drinking alcohol. Do not ride with someone who has been drinking. When you are tired or distracted. While texting. Wear a helmet and other protective equipment during sports activities. If you have firearms in your house, make sure you follow all gun safety procedures. Seek help if you have been bullied, physically abused, or sexually abused. Use the Internet responsibly to avoid dangers, such as online bullying and online sex predators. What's next? Go to your health care provider once a year for an annual wellness visit. Ask your health care provider how often you should have your eyes and teeth checked. Stay up to date on all vaccines. This information is not intended to replace advice given to you by your health care provider. Make sure you discuss any questions you have with your healthcare provider. Document Revised: 05/01/2019 Document Reviewed: 08/09/2018 Elsevier Patient Education  2022 [employer]  MobeetieWanda K. Jhordan Kinter M.D.

## 2021-04-13 ENCOUNTER — Encounter: Payer: Self-pay | Admitting: Internal Medicine

## 2021-04-13 ENCOUNTER — Ambulatory Visit (INDEPENDENT_AMBULATORY_CARE_PROVIDER_SITE_OTHER): Payer: No Typology Code available for payment source | Admitting: Internal Medicine

## 2021-04-13 VITALS — BP 112/70 | HR 82 | Temp 98.1°F | Ht 66.0 in | Wt 137.0 lb

## 2021-04-13 DIAGNOSIS — Z0000 Encounter for general adult medical examination without abnormal findings: Secondary | ICD-10-CM | POA: Diagnosis not present

## 2021-04-13 DIAGNOSIS — J351 Hypertrophy of tonsils: Secondary | ICD-10-CM | POA: Diagnosis not present

## 2021-04-13 DIAGNOSIS — R590 Localized enlarged lymph nodes: Secondary | ICD-10-CM

## 2021-04-13 LAB — CBC WITH DIFFERENTIAL/PLATELET
Basophils Absolute: 0 10*3/uL (ref 0.0–0.1)
Basophils Relative: 0.5 % (ref 0.0–3.0)
Eosinophils Absolute: 0.2 10*3/uL (ref 0.0–0.7)
Eosinophils Relative: 3 % (ref 0.0–5.0)
HCT: 43.5 % (ref 39.0–52.0)
Hemoglobin: 15.1 g/dL (ref 13.0–17.0)
Lymphocytes Relative: 50.1 % — ABNORMAL HIGH (ref 12.0–46.0)
Lymphs Abs: 2.7 10*3/uL (ref 0.7–4.0)
MCHC: 34.7 g/dL (ref 30.0–36.0)
MCV: 86.8 fl (ref 78.0–100.0)
Monocytes Absolute: 0.4 10*3/uL (ref 0.1–1.0)
Monocytes Relative: 7.7 % (ref 3.0–12.0)
Neutro Abs: 2.1 10*3/uL (ref 1.4–7.7)
Neutrophils Relative %: 38.7 % — ABNORMAL LOW (ref 43.0–77.0)
Platelets: 220 10*3/uL (ref 150.0–400.0)
RBC: 5.01 Mil/uL (ref 4.22–5.81)
RDW: 12.8 % (ref 11.5–14.6)
WBC: 5.4 10*3/uL (ref 4.5–10.5)

## 2021-04-13 LAB — LIPID PANEL
Cholesterol: 181 mg/dL (ref 0–200)
HDL: 40.8 mg/dL (ref >39.00)
LDL Cholesterol: 117 mg/dL — ABNORMAL HIGH (ref 0–99)
NonHDL: 140.66
Total CHOL/HDL Ratio: 4
Triglycerides: 120 mg/dL (ref 0.0–149.0)
VLDL: 24 mg/dL (ref 0.0–40.0)

## 2021-04-13 LAB — BASIC METABOLIC PANEL
BUN: 14 mg/dL (ref 6–23)
CO2: 30 mEq/L (ref 19–32)
Calcium: 9.4 mg/dL (ref 8.4–10.5)
Chloride: 101 mEq/L (ref 96–112)
Creatinine, Ser: 1.09 mg/dL (ref 0.40–1.50)
GFR: 97.54 mL/min (ref >60.00)
Glucose, Bld: 87 mg/dL (ref 70–99)
Potassium: 4 mEq/L (ref 3.5–5.1)
Sodium: 137 mEq/L (ref 135–145)

## 2021-04-13 LAB — HEPATIC FUNCTION PANEL
ALT: 31 U/L (ref 0–53)
AST: 19 U/L (ref 0–37)
Albumin: 4.4 g/dL (ref 3.5–5.2)
Alkaline Phosphatase: 106 U/L (ref 39–117)
Bilirubin, Direct: 0.1 mg/dL (ref 0.0–0.3)
Total Bilirubin: 0.6 mg/dL (ref 0.2–1.2)
Total Protein: 7 g/dL (ref 6.0–8.3)

## 2021-04-13 LAB — C-REACTIVE PROTEIN: CRP: <1 mg/dL (ref 0.5–20.0)

## 2021-04-13 NOTE — Patient Instructions (Signed)
Good to see you today  Suspect that you have a partly treated  tonsillitis   that causing the swollen glands   Checking for MONO a virus ( EBV) that  can cause sx for weeks before  resolves on its own.   We may add another course  of antibiotics   and fu if not better .  Your exam is reassuring  and no other signs of  enlarged  lymph glands .  Continue lifestyle intervention healthy eating and exercise .   Preventive Care 70-21 Years Old, Male Preventive care refers to lifestyle choices and visits with your health care provider that can promote health and wellness. At this stage in your life, you may start seeing a primary care physician instead of a pediatrician. It is important to take responsibility for your health and well-being. Preventive care for young adults includes: A yearly physical exam. This is also called an annual wellness visit. Regular dental and eye exams. Immunizations. Screening for certain conditions. Healthy lifestyle choices, such as: Eating a healthy diet. Getting regular exercise. Not using drugs or products that contain nicotine and tobacco. Limiting alcohol use. What can I expect for my preventive care visit? Physical exam Your health care provider may check your: Height and weight. These may be used to calculate your BMI (body mass index). BMI is a measurement that tells if you are at a healthy weight. Heart rate and blood pressure. Body temperature. Skin for abnormal spots. Counseling Your health care provider may ask you questions about your: Past medical problems. Family's medical history. Alcohol, tobacco, and drug use. Home life and relationship well-being. Access to firearms. Emotional well-being. Diet, exercise, and sleep habits. Sexual activity and sexual health. What immunizations do I need?  Vaccines are usually given at various ages, according to a schedule. Your health care provider will recommend vaccines for you based on your age,  medicalhistory, and lifestyle or other factors, such as travel or where you work. What tests do I need? Blood tests Lipid and cholesterol levels. These may be checked every 5 years starting at age 52. Hepatitis C test. Hepatitis B test. Screening Genital exam to check for testicular cancer or hernias. STD (sexually transmitted disease) testing, if you are at risk. Other tests Tuberculosis skin test. Vision and hearing tests. Skin exam. Talk with your health care provider about your test results, treatment options,and if necessary, the need for more tests. Follow these instructions at home: Eating and drinking  Eat a healthy diet that includes fresh fruits and vegetables, whole grains, lean protein, and low-fat dairy products. Drink enough fluid to keep your urine pale yellow. Do not drink alcohol if: Your health care provider tells you not to drink. You are under the legal drinking age. In the [place] the legal drinking age is 82. If you drink alcohol: Limit how much you use to 0-2 drinks a day. Be aware of how much alcohol is in your drink. In the [place] one drink equals one 12 oz bottle of beer (355 mL), one 5 oz glass of wine (148 mL), or one 1 oz glass of hard liquor (44 mL).  Lifestyle Take daily care of your teeth and gums. Brush your teeth every morning and night with fluoride toothpaste. Floss one time each day. Stay active. Exercise for at least 30 minutes 5 or more days of the week. Do not use any products that contain nicotine or tobacco, such as cigarettes, e-cigarettes, and chewing tobacco. If you need help  quitting, ask your health care provider. Do not use drugs. If you are sexually active, practice safe sex. Use a condom or other form of protection to prevent STIs (sexually transmitted infections). Find healthy ways to cope with stress, such as: Meditation, yoga, or listening to music. Journaling. Talking to a trusted person. Spending time with friends and  family. Safety Always wear your seat belt while driving or riding in a vehicle. Do not drive: If you have been drinking alcohol. Do not ride with someone who has been drinking. When you are tired or distracted. While texting. Wear a helmet and other protective equipment during sports activities. If you have firearms in your house, make sure you follow all gun safety procedures. Seek help if you have been bullied, physically abused, or sexually abused. Use the Internet responsibly to avoid dangers, such as online bullying and online sex predators. What's next? Go to your health care provider once a year for an annual wellness visit. Ask your health care provider how often you should have your eyes and teeth checked. Stay up to date on all vaccines. This information is not intended to replace advice given to you by your health care provider. Make sure you discuss any questions you have with your healthcare provider. Document Revised: 05/01/2019 Document Reviewed: 08/09/2018 Elsevier Patient Education  2022 [employer].

## 2021-04-14 LAB — EPSTEIN-BARR VIRUS VCA ANTIBODY PANEL
EBV NA IgG: <18 U/mL
EBV VCA IgG: <18 U/mL
EBV VCA IgM: <36 U/mL

## 2021-04-14 MED ORDER — AMOXICILLIN-POT CLAVULANATE 875-125 MG PO TABS: 1.0000 | ORAL_TABLET | Freq: Two times a day (BID) | ORAL | 0 refills | Status: DC

## 2021-04-14 NOTE — Addendum Note (Signed)
Addended by: Christy Sartorius on: 04/14/2021 02:17 PM   Modules accepted: Orders

## 2021-04-14 NOTE — Progress Notes (Signed)
MONO test is negative   cholesterol  ok  ldl up slightly  from last check ) Continue lifestyle intervention healthy eating and exercise  Please send in augmentin 875 1 po bid for 10 days  disp 20  ( take with food)   to take for the throat and swollen glands.  Contact us after treatment  about  status . If not getting better plan fu .  Remind him to  sign up for my chart
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
## 2022-05-04 ENCOUNTER — Encounter: Payer: No Typology Code available for payment source | Admitting: Internal Medicine

## 2022-06-28 ENCOUNTER — Encounter: Payer: Self-pay | Admitting: Internal Medicine

## 2022-06-28 ENCOUNTER — Ambulatory Visit (INDEPENDENT_AMBULATORY_CARE_PROVIDER_SITE_OTHER): Payer: PRIVATE HEALTH INSURANCE | Admitting: Internal Medicine

## 2022-06-28 VITALS — BP 120/78 | HR 69 | Temp 98.2°F | Ht 65.0 in | Wt 153.4 lb

## 2022-06-28 DIAGNOSIS — Z0000 Encounter for general adult medical examination without abnormal findings: Secondary | ICD-10-CM | POA: Diagnosis not present

## 2022-06-28 DIAGNOSIS — Z23 Encounter for immunization: Secondary | ICD-10-CM

## 2022-06-28 NOTE — Progress Notes (Signed)
Chief Complaint  Patient presents with   Annual Exam    HPI: Patient  Curtis Frost  21 y.o. comes in today for Preventive Health Care visit  No major changes in health   Health Maintenance  Topic Date Due   Hepatitis C Screening  Never done   COVID-19 Vaccine (4 - Pfizer series) 12/16/2020   INFLUENZA VACCINE  03/29/2022   TETANUS/TDAP  03/30/2022   HPV VACCINES  Completed   HIV Screening  Completed   Health Maintenance Review LIFESTYLE:  Exercise:  gyme 5 d per week  Tobacco/ETS: no Alcohol: no Sugar beverages: not much  weekend  Sleep:6- 8  hours  Drug use: no HH of 2  no pets  Work:/school [employer] interested in forensic  area   works 20 - 25 hour per week [employer]     ROS:  GEN/ HEENT: No fever, significant weight changes sweats headaches vision problems hearing changes, CV/ PULM; No chest pain shortness of breath cough, syncope,edema  change in exercise tolerance. GI /GU: No adominal pain, vomiting, change in bowel habits. No blood in the stool. No significant GU symptoms. SKIN/HEME: ,no acute skin rashes suspicious lesions or bleeding. No lymphadenopathy, nodules, masses.  NEURO/ PSYCH:  No neurologic signs such as weakness numbness. No depression anxiety. IMM/ Allergy: No unusual infections.  Allergy .   REST of 12 system review negative except as per HPI   Past Medical History:  Diagnosis Date   Epistaxis 04/20/2010   Qualifier: Diagnosis of  By: Fabian Sharp MD, Neta Mends     History reviewed. No pertinent surgical history.  Family History  Problem Relation Age of Onset   Diabetes Other        both grandparents generations   Hyperlipidemia Mother        rx with diet    Social History   Socioeconomic History   Marital status: Single    Spouse name: Not on file   Number of children: Not on file   Years of education: Not on file   Highest education level: Not on file  Occupational History   Not on file  Tobacco Use   Smoking  status: Never   Smokeless tobacco: Never  Vaping Use   Vaping Use: Never used  Substance and Sexual Activity   Alcohol use: No   Drug use: No   Sexual activity: Not Currently  Other Topics Concern   Not on file  Social History Narrative   Elem  [employer]inar [employer] in school middle school  [employer]  To [employer]    Neg tad   Soccer.     HH of 3    No pets     Social Determinants of Corporate investment banker Strain: Not on file  Food Insecurity: Not on file  Transportation Needs: Not on file  Physical Activity: Not on file  Stress: Not on file  Social Connections: Not on file    NO meds     EXAM:  BP 120/78 (BP Location: Left Arm, Patient Position: Sitting, Cuff Size: Normal)   Pulse 69   Temp 98.2 F (36.8 C) (Oral)   Ht 5\' 5"  (1.651 m)   Wt 153 lb 6.4 oz (69.6 kg)   SpO2 97%   BMI 25.53 kg/m   Body mass index is 25.53 kg/m. Wt Readings from Last 3 Encounters:  06/28/22 153 lb 6.4 oz (69.6 kg)  04/13/21 137 lb (62.1 kg)  02/22/21 140 lb (  63.5 kg)    Physical Exam: Vital signs reviewed [id number] is a well-developed well-nourished alert cooperative    who appearsr stated age in no acute distress.  HEENT: normocephalic atraumatic , Eyes: PERRL EOM's full, conjunctiva clear, Nares: paten,t no deformity discharge or tenderness., Ears: no deformity EAC's clear TMs with normal landmarks. Mouth: clear OP, no lesions, edema.  Moist mucous membranes. Dentition in adequate repair.tonsil 0-1+ left good airway  NECK: supple without masses, thyromegaly or bruits. CHEST/PULM:  Clear to auscultation and percussion breath sounds equal no wheeze , rales or rhonchi. No chest wall deformities or tenderness. CV: PMI is nondisplaced, S1 S2 no gallops, murmurs, rubs. Peripheral pulses are full without delay. ABDOMEN: Bowel sounds normal nontender  No guard or rebound, no hepato splenomegal no CVA tenderness.  No hernia. Extremtities:  No clubbing cyanosis or edema, no acute  joint swelling or redness no focal atrophy NEURO:  Oriented x3, cranial nerves 3-12 appear to be intact, no obvious focal weakness,gait within normal limits no abnormal reflexes or asymmetrical SKIN: No acute rashes normal turgor, color, no bruising or petechiae. PSYCH: Oriented, good eye contact, no obvious depression anxiety, cognition and judgment appear normal. LN: no cervical axillary inguinal adenopathy  Lab Results  Component Value Date   WBC 5.4 04/13/2021   HGB 15.1 04/13/2021   HCT 43.5 04/13/2021   PLT 220.0 04/13/2021   GLUCOSE 87 04/13/2021   CHOL 181 04/13/2021   TRIG 120.0 04/13/2021   HDL 40.80 04/13/2021   LDLCALC 117 (H) 04/13/2021   ALT 31 04/13/2021   AST 19 04/13/2021   NA 137 04/13/2021   K 4.0 04/13/2021   CL 101 04/13/2021   CREATININE 1.09 04/13/2021   BUN 14 04/13/2021   CO2 30 04/13/2021   TSH 1.80 03/14/2018    BP Readings from Last 3 Encounters:  06/28/22 120/78  04/13/21 112/70  02/22/21 120/70    Previous  Lab results reviewed with patient   ASSESSMENT AND PLAN:  Discussed the following assessment and plan:    ICD-10-CM   1. Visit for preventive health examination  Z00.00      Flu tdap today Lsi continue  no compelling reason for blood work today. Fu lipid panel in 2-3 years or as indicated  Return in about 1 year (around 06/29/2023) for or if needed .  Patient Care Team: Burnis Medin, MD as PCP - General Patient Instructions  Good to see you today . Your exam is normal . Continue attention to  lifestyle intervention healthy eating and exercise .  Cholesterol ldl was borderline up last year     Jenaye Rickert K. Abrial Arrighi M.D.

## 2022-06-28 NOTE — Patient Instructions (Addendum)
Good to see you today . Your exam is normal . Continue attention to  lifestyle intervention healthy eating and exercise .  Cholesterol ldl was borderline up last year

## 2022-06-28 NOTE — Addendum Note (Signed)
Addended by: Encarnacion Slates on: 06/28/2022 04:36 PM   Modules accepted: Orders

## 2022-12-02 DIAGNOSIS — J069 Acute upper respiratory infection, unspecified: Secondary | ICD-10-CM | POA: Diagnosis not present

## 2022-12-02 DIAGNOSIS — R000 Tachycardia, unspecified: Secondary | ICD-10-CM | POA: Diagnosis not present

## 2022-12-02 DIAGNOSIS — R059 Cough, unspecified: Secondary | ICD-10-CM | POA: Diagnosis not present

## 2023-03-28 NOTE — Progress Notes (Unsigned)
No chief complaint on file.   HPI: Patient  Curtis Frost  22 y.o. comes in today for Preventive Health Care visit  Last pv 10 23   Health Maintenance  Topic Date Due   Hepatitis C Screening  Never done   COVID-19 Vaccine (4 - 2023-24 season) 04/29/2022   INFLUENZA VACCINE  03/30/2023   DTaP/Tdap/Td (3 - Td or Tdap) 06/28/2032   HPV VACCINES  Completed   HIV Screening  Completed   Health Maintenance Review LIFESTYLE:  Exercise:   Tobacco/ETS: Alcohol:  Sugar beverages: Sleep: Drug use: no HH of  Work:    ROS:  GEN/ HEENT: No fever, significant weight changes sweats headaches vision problems hearing changes, CV/ PULM; No chest pain shortness of breath cough, syncope,edema  change in exercise tolerance. GI /GU: No adominal pain, vomiting, change in bowel habits. No blood in the stool. No significant GU symptoms. SKIN/HEME: ,no acute skin rashes suspicious lesions or bleeding. No lymphadenopathy, nodules, masses.  NEURO/ PSYCH:  No neurologic signs such as weakness numbness. No depression anxiety. IMM/ Allergy: No unusual infections.  Allergy .   REST of 12 system review negative except as per HPI   Past Medical History:  Diagnosis Date   Epistaxis 04/20/2010   Qualifier: Diagnosis of  By: Fabian Sharp MD, Neta Mends     No past surgical history on file.  Family History  Problem Relation Age of Onset   Diabetes Other        both grandparents generations   Hyperlipidemia Mother        rx with diet    Social History   Socioeconomic History   Marital status: Single    Spouse name: Not on file   Number of children: Not on file   Years of education: Not on file   Highest education level: Not on file  Occupational History   Not on file  Tobacco Use   Smoking status: Never   Smokeless tobacco: Never  Vaping Use   Vaping status: Never Used  Substance and Sexual Activity   Alcohol use: No   Drug use: No   Sexual activity: Not Currently  Other Topics Concern    Not on file  Social History Narrative   Elem  [employer]inar [employer] in school middle school  [employer]  To [employer]    Neg tad   Soccer.     HH of 3    No pets     Social Determinants of Corporate investment banker Strain: Not on file  Food Insecurity: Not on file  Transportation Needs: Not on file  Physical Activity: Not on file  Stress: Not on file  Social Connections: Unknown (01/09/2022)   Received from [facility], [facility]   Social Network    Social Network: Not on file    No outpatient medications prior to visit.   No facility-administered medications prior to visit.     EXAM:  There were no vitals taken for this visit.  There is no height or weight on file to calculate BMI. Wt Readings from Last 3 Encounters:  06/28/22 153 lb 6.4 oz (69.6 kg)  04/13/21 137 lb (62.1 kg)  02/22/21 140 lb (63.5 kg)    Physical Exam: Vital signs reviewed [id number] is a well-developed well-nourished alert cooperative    who appearsr stated age in no acute distress.  HEENT: normocephalic atraumatic , Eyes: PERRL EOM's full, conjunctiva clear, Nares: paten,t no deformity discharge or tenderness.,  Ears: no deformity EAC's clear TMs with normal landmarks. Mouth: clear OP, no lesions, edema.  Moist mucous membranes. Dentition in adequate repair. NECK: supple without masses, thyromegaly or bruits. CHEST/PULM:  Clear to auscultation and percussion breath sounds equal no wheeze , rales or rhonchi. No chest wall deformities or tenderness. CV: PMI is nondisplaced, S1 S2 no gallops, murmurs, rubs. Peripheral pulses are full without delay.No JVD .  ABDOMEN: Bowel sounds normal nontender  No guard or rebound, no hepato splenomegal no CVA tenderness.  No hernia. Extremtities:  No clubbing cyanosis or edema, no acute joint swelling or redness no focal atrophy NEURO:  Oriented x3, cranial nerves 3-12 appear to be intact, no obvious focal weakness,gait within normal limits no abnormal  reflexes or asymmetrical SKIN: No acute rashes normal turgor, color, no bruising or petechiae. PSYCH: Oriented, good eye contact, no obvious depression anxiety, cognition and judgment appear normal. LN: no cervical axillary inguinal adenopathy  Lab Results  Component Value Date   WBC 5.4 04/13/2021   HGB 15.1 04/13/2021   HCT 43.5 04/13/2021   PLT 220.0 04/13/2021   GLUCOSE 87 04/13/2021   CHOL 181 04/13/2021   TRIG 120.0 04/13/2021   HDL 40.80 04/13/2021   LDLCALC 117 (H) 04/13/2021   ALT 31 04/13/2021   AST 19 04/13/2021   NA 137 04/13/2021   K 4.0 04/13/2021   CL 101 04/13/2021   CREATININE 1.09 04/13/2021   BUN 14 04/13/2021   CO2 30 04/13/2021   TSH 1.80 03/14/2018    BP Readings from Last 3 Encounters:  06/28/22 120/78  04/13/21 112/70  02/22/21 120/70    Lab plan  reviewed with patient   ASSESSMENT AND PLAN:  Discussed the following assessment and plan:    ICD-10-CM   1. Visit for preventive health examination  Z00.00      No follow-ups on file.  Patient Care Team: Madelin Headings, MD as PCP - General There are no Patient Instructions on file for this visit.  Neta Mends. Cassandria Drew M.D.

## 2023-03-29 ENCOUNTER — Encounter: Payer: Self-pay | Admitting: Internal Medicine

## 2023-03-29 ENCOUNTER — Ambulatory Visit (INDEPENDENT_AMBULATORY_CARE_PROVIDER_SITE_OTHER): Payer: BC Managed Care – PPO | Admitting: Internal Medicine

## 2023-03-29 VITALS — BP 110/82 | HR 79 | Temp 98.1°F | Ht 65.1 in | Wt 168.6 lb

## 2023-03-29 DIAGNOSIS — Z0000 Encounter for general adult medical examination without abnormal findings: Secondary | ICD-10-CM | POA: Diagnosis not present

## 2023-03-29 DIAGNOSIS — Z13220 Encounter for screening for lipoid disorders: Secondary | ICD-10-CM | POA: Diagnosis not present

## 2023-03-29 DIAGNOSIS — Z1159 Encounter for screening for other viral diseases: Secondary | ICD-10-CM | POA: Diagnosis not present

## 2023-03-29 LAB — CBC WITH DIFFERENTIAL/PLATELET
Basophils Absolute: 0 10*3/uL (ref 0.0–0.1)
Basophils Relative: 0.4 % (ref 0.0–3.0)
Eosinophils Absolute: 0.3 10*3/uL (ref 0.0–0.7)
Eosinophils Relative: 4 % (ref 0.0–5.0)
HCT: 44.6 % (ref 39.0–52.0)
Hemoglobin: 15.4 g/dL (ref 13.0–17.0)
Lymphocytes Relative: 47.7 % — ABNORMAL HIGH (ref 12.0–46.0)
Lymphs Abs: 3.1 10*3/uL (ref 0.7–4.0)
MCHC: 34.5 g/dL (ref 30.0–36.0)
MCV: 86.9 fl (ref 78.0–100.0)
Monocytes Absolute: 0.4 10*3/uL (ref 0.1–1.0)
Monocytes Relative: 6.6 % (ref 3.0–12.0)
Neutro Abs: 2.7 10*3/uL (ref 1.4–7.7)
Neutrophils Relative %: 41.3 % — ABNORMAL LOW (ref 43.0–77.0)
Platelets: 258 10*3/uL (ref 150.0–400.0)
RBC: 5.13 Mil/uL (ref 4.22–5.81)
RDW: 13.2 % (ref 11.5–15.5)
WBC: 6.6 10*3/uL (ref 4.0–10.5)

## 2023-03-29 LAB — COMPREHENSIVE METABOLIC PANEL
ALT: 75 U/L — ABNORMAL HIGH (ref 0–53)
AST: 36 U/L (ref 0–37)
Albumin: 4.8 g/dL (ref 3.5–5.2)
Alkaline Phosphatase: 80 U/L (ref 39–117)
BUN: 12 mg/dL (ref 6–23)
CO2: 29 mEq/L (ref 19–32)
Calcium: 9.9 mg/dL (ref 8.4–10.5)
Chloride: 101 mEq/L (ref 96–112)
Creatinine, Ser: 1.04 mg/dL (ref 0.40–1.50)
GFR: 101.79 mL/min (ref >60.00)
Glucose, Bld: 88 mg/dL (ref 70–99)
Potassium: 3.9 mEq/L (ref 3.5–5.1)
Sodium: 139 mEq/L (ref 135–145)
Total Bilirubin: 0.7 mg/dL (ref 0.2–1.2)
Total Protein: 8 g/dL (ref 6.0–8.3)

## 2023-03-29 LAB — TSH: TSH: 0.86 u[IU]/mL (ref 0.35–5.50)

## 2023-03-29 LAB — LDL CHOLESTEROL, DIRECT: Direct LDL: 157 mg/dL

## 2023-03-29 LAB — LIPID PANEL
Cholesterol: 246 mg/dL — ABNORMAL HIGH (ref 0–200)
HDL: 41 mg/dL (ref >39.00)
NonHDL: 204.63
Total CHOL/HDL Ratio: 6
Triglycerides: 232 mg/dL — ABNORMAL HIGH (ref 0.0–149.0)
VLDL: 46.4 mg/dL — ABNORMAL HIGH (ref 0.0–40.0)

## 2023-03-29 NOTE — Patient Instructions (Signed)
Good to see you today . Exam is normal.  Agree attention to avoid weight gain Continue lifestyle intervention healthy eating and exercise .  Lab today  results will be on my chart .

## 2023-04-06 NOTE — Progress Notes (Signed)
Cholesterol panel is  not favorable elevated compared to last year   triglycerides high and ldl hif for your age .   This could be related to  weight gain . No diabetes  Minor liver elevation of  one liver enzyme  many causes that can be temporary .  So avoid excess advil aleve tykenol meds supplements  and alcohol.  Intensify lifestyle interventions.  Exercise and diet changes  .  Lets recheck lab in 4 months      please arrange fasting lab lipid panel , lipoprotein A and LFTs  the go from there

## 2023-11-30 ENCOUNTER — Ambulatory Visit: Admitting: Internal Medicine

## 2023-11-30 ENCOUNTER — Encounter: Payer: Self-pay | Admitting: Internal Medicine

## 2023-11-30 VITALS — BP 104/60 | HR 91 | Temp 98.3°F | Ht 65.1 in | Wt 174.0 lb

## 2023-11-30 DIAGNOSIS — E7800 Pure hypercholesterolemia, unspecified: Secondary | ICD-10-CM | POA: Diagnosis not present

## 2023-11-30 DIAGNOSIS — R7989 Other specified abnormal findings of blood chemistry: Secondary | ICD-10-CM

## 2023-11-30 DIAGNOSIS — M216X1 Other acquired deformities of right foot: Secondary | ICD-10-CM | POA: Diagnosis not present

## 2023-11-30 DIAGNOSIS — M25562 Pain in left knee: Secondary | ICD-10-CM

## 2023-11-30 DIAGNOSIS — Z0000 Encounter for general adult medical examination without abnormal findings: Secondary | ICD-10-CM

## 2023-11-30 NOTE — Patient Instructions (Addendum)
 Good to see you  Consider sports  medicine   shoe inserts . R foot has  some pronation   Optimize healthy eating and exercise ok .  Plan fasting labs  before   or at check up in summer  as you cholesterol panel was suboptimal last year.

## 2023-11-30 NOTE — Progress Notes (Signed)
 Chief Complaint  Patient presents with   Knee Pain    Pt reports he does weight lifting. Feels pain when bend knees lower. Noticed it this Sunday   Foot Pain    Pt c/o foot pain on both side. Pt reports he runs and feels plantar sx on bottom of feet. Feels soreness when walk. Going on for about a year.     HPI: Curtis Frost 23 y.o. come in for a coup;e of issues   Left ? Anterior knee  pain  with deep  sq with load can give out.  Not often .  Feels  like going to buckles   ? If intervention doing more intense load work out.  R foot bother  arch plantar surface bothersome with running   no injury  ? If ued inserts no injury  Played intense soccer in hs etc.   Creatine supplements a few times per week . Recently working on  better diet and eating  late at night .   With gf  Asks about gu anatomy foreskin longer ?   No pain sexual dysfunction  dc stricture  no sx just wasn't  sure .   ROS: See pertinent positives and negatives per HPI.  Past Medical History:  Diagnosis Date   Epistaxis 04/20/2010   Qualifier: Diagnosis of  By: Fabian Sharp MD, Neta Mends     Family History  Problem Relation Age of Onset   Diabetes Other        both grandparents generations   Hyperlipidemia Mother        rx with diet    Social History   Socioeconomic History   Marital status: Single    Spouse name: Not on file   Number of children: Not on file   Years of education: Not on file   Highest education level: Not on file  Occupational History   Not on file  Tobacco Use   Smoking status: Never   Smokeless tobacco: Never  Vaping Use   Vaping status: Never Used  Substance and Sexual Activity   Alcohol use: No   Drug use: No   Sexual activity: Not Currently  Other Topics Concern   Not on file  Social History Narrative   Elem  [employer]inar [employer] in school middle school  [employer]  To [employer]    Neg tad   Soccer.     HH of 3    No pets     Social Drivers of Research scientist (physical sciences) Strain: Not on file  Food Insecurity: No Food Insecurity (03/29/2023)   Hunger Vital Sign    Worried About Running Out of Food in the Last Year: Never true    Ran Out of Food in the Last Year: Never true  Transportation Needs: No Transportation Needs (03/29/2023)   PRAPARE - Administrator, Civil Service (Medical): No    Lack of Transportation (Non-Medical): No  Physical Activity: Sufficiently Active (03/29/2023)   Exercise Vital Sign    Days of Exercise per Week: 5 days    Minutes of Exercise per Session: 70 min  Stress: No Stress Concern Present (03/29/2023)   [employer] of Occupational Health - Occupational Stress Questionnaire    Feeling of Stress : Not at all  Social Connections: Moderately Integrated (03/29/2023)   Social Connection and Isolation Panel [NHANES]    Frequency of Communication with Friends and Family: More than three times a week  Frequency of Social Gatherings with Friends and Family: Once a week    Attends Religious Services: More than 4 times per year    Active Member of [employer] or Organizations: Yes    Attends Engineer, structural: More than 4 times per year    Marital Status: Never married    No outpatient medications prior to visit.   No facility-administered medications prior to visit.     EXAM:  BP 104/60 (BP Location: Right Arm, Patient Position: Sitting, Cuff Size: Large)   Pulse 91   Temp 98.3 F (36.8 C) (Oral)   Ht 5' 5.1" (1.654 m)   Wt 174 lb (78.9 kg)   SpO2 98%   BMI 28.87 kg/m   Body mass index is 28.87 kg/m. Wt Readings from Last 3 Encounters:  11/30/23 174 lb (78.9 kg)  03/29/23 168 lb 9.6 oz (76.5 kg)  06/28/22 153 lb 6.4 oz (69.6 kg)   BP Readings from Last 3 Encounters:  11/30/23 104/60  03/29/23 110/82  06/28/22 120/78    GENERAL: vitals reviewed and listed above, alert, oriented, appears well hydrated and in no acute distress HEENT: atraumatic, conjunctiva  clear, no obvious  abnormalities on inspection of external nose and ears   NECK: no obvious masses on inspection palpation  CV: HRRR, no clubbing cyanosis or  peripheral edema nl cap refill  MS: moves all extremities without noticeable focal  abnormality left knee  no effusion deformity  neg drawer good muscle mass   right foot  slight pronation and min arch  no point tenderness  Decline gu exam asks what to look for abnomals  PSYCH: pleasant and cooperative, no obvious depression or anxiety Lab Results  Component Value Date   WBC 6.6 03/29/2023   HGB 15.4 03/29/2023   HCT 44.6 03/29/2023   PLT 258.0 03/29/2023   GLUCOSE 88 03/29/2023   CHOL 246 (H) 03/29/2023   TRIG 232.0 (H) 03/29/2023   HDL 41.00 03/29/2023   LDLDIRECT 157.0 03/29/2023   LDLCALC 117 (H) 04/13/2021   ALT 75 (H) 03/29/2023   AST 36 03/29/2023   NA 139 03/29/2023   K 3.9 03/29/2023   CL 101 03/29/2023   CREATININE 1.04 03/29/2023   BUN 12 03/29/2023   CO2 29 03/29/2023   TSH 0.86 03/29/2023   BP Readings from Last 3 Encounters:  11/30/23 104/60  03/29/23 110/82  06/28/22 120/78   Reviewed past labs   has seen the results  but looks like no fu   Lipid panel was very good 1-2 years ago  ASSESSMENT AND PLAN:  Discussed the following assessment and plan:  Pronation of right foot - mild  but low arch  may be the source  is a runner  disc shoes and inserts and consider seeing Dr fields et al if need more input.  Left knee pain, unspecified chronicity - and ? stability with extra load and  certain exercise  seems anterior disc Disc avoid past 90 deg and optimize balance strength of all around knee disc .  And consider  seein sm if cannot reach goal  because of on going sx   Gu sx  sounds like  no abnormality  offered exam today . Urology if any sx discussed . Disc lipid profile and lfts from last visit  and plan repeat in summer  but plan Continue lifestyle intervention healthy eating and exercise .  -Patient advised to return  or notify health care team  if  new concerns  arise.  Patient Instructions  Good to see you  Consider sports  medicine   shoe inserts . R foot has  some pronation   Optimize healthy eating and exercise ok .  Plan fasting labs  before   or at check up in summer  as you cholesterol panel was suboptimal last year.    Neta Mends. Tyrel Lex M.D.

## 2024-04-02 ENCOUNTER — Ambulatory Visit (INDEPENDENT_AMBULATORY_CARE_PROVIDER_SITE_OTHER): Admitting: Internal Medicine

## 2024-04-02 ENCOUNTER — Encounter: Payer: Self-pay | Admitting: Internal Medicine

## 2024-04-02 VITALS — BP 110/70 | HR 64 | Temp 98.0°F | Ht 65.1 in | Wt 170.4 lb

## 2024-04-02 DIAGNOSIS — Z0000 Encounter for general adult medical examination without abnormal findings: Secondary | ICD-10-CM

## 2024-04-02 DIAGNOSIS — Z833 Family history of diabetes mellitus: Secondary | ICD-10-CM | POA: Diagnosis not present

## 2024-04-02 DIAGNOSIS — R7401 Elevation of levels of liver transaminase levels: Secondary | ICD-10-CM | POA: Diagnosis not present

## 2024-04-02 NOTE — Progress Notes (Signed)
 Chief Complaint  Patient presents with   Annual Exam    HPI: Patient  Curtis Frost  23 y.o. comes in today for Preventive Health Care visit   Running and 2 miles   some exercises stretchign helping foot ( see April visit) Addressed eating habits   recently asks about intermittent fasting  dietary consults . Etc   Dm  and   HT in family . Health Maintenance  Topic Date Due   INFLUENZA VACCINE  03/29/2024   COVID-19 Vaccine (4 - 2024-25 season) 04/18/2024 (Originally 04/30/2023)   Hepatitis B Vaccines (1 of 3 - 19+ 3-dose series) 04/02/2025 (Originally 07/19/2019)   DTaP/Tdap/Td (8 - Td or Tdap) 06/28/2032   HPV VACCINES  Completed   Hepatitis C Screening  Completed   HIV Screening  Completed   Meningococcal B Vaccine  Completed   Health Maintenance Review LIFESTYLE:  Exercise:   yes  Tobacco/ETS: n Alcohol:  ocass Sugar beverages: ocass more water  Sleep:  7at mos  Drug use: no HH of  2  no pets  Work:  [employer] 40 hours    ROS:  GEN/ HEENT: No fever, significant weight changes sweats headaches vision problems hearing changes, CV/ PULM; No chest pain shortness of breath cough, syncope,edema  change in exercise tolerance. GI /GU: No adominal pain, vomiting, change in bowel habits. No blood in the stool. No significant GU symptoms. SKIN/HEME: ,no acute skin rashes suspicious lesions or bleeding. No lymphadenopathy, nodules, masses.  NEURO/ PSYCH:  No neurologic signs such as weakness numbness. No depression anxiety. IMM/ Allergy: No unusual infections.  Allergy .   REST of 12 system review negative except as per HPI   Past Medical History:  Diagnosis Date   Epistaxis 04/20/2010   Qualifier: Diagnosis of  By: Charlett MD, Apolinar POUR     History reviewed. No pertinent surgical history.  Family History  Problem Relation Age of Onset   Diabetes Other        both grandparents generations   Hyperlipidemia Mother        rx with diet    Social History    Socioeconomic History   Marital status: Single    Spouse name: Not on file   Number of children: Not on file   Years of education: Not on file   Highest education level: Bachelor's degree (e.g., BA, AB, BS)  Occupational History   Not on file  Tobacco Use   Smoking status: Never   Smokeless tobacco: Never  Vaping Use   Vaping status: Never Used  Substance and Sexual Activity   Alcohol use: No   Drug use: No   Sexual activity: Not Currently  Other Topics Concern   Not on file  Social History Narrative   Elem  [employer] in school [employer]  [employer]  To [employer]    Neg tad   Soccer.     HH of 3    No pets     Social Drivers of Corporate investment banker Strain: Low Risk  (03/29/2024)   Overall Financial Resource Strain (CARDIA)    Difficulty of Paying Living Expenses: Not very hard  Food Insecurity: No Food Insecurity (03/29/2024)   Hunger Vital Sign    Worried About Running Out of Food in the Last Year: Never true    Ran Out of Food in the Last Year: Never true  Transportation Needs: No Transportation Needs (03/29/2024)   PRAPARE - Transportation  Lack of Transportation (Medical): No    Lack of Transportation (Non-Medical): No  Physical Activity: Sufficiently Active (03/29/2024)   Exercise Vital Sign    Days of Exercise per Week: 3 days    Minutes of Exercise per Session: 60 min  Stress: No Stress Concern Present (03/29/2024)   [employer] of Occupational Health - Occupational Stress Questionnaire    Feeling of Stress: Only a little  Social Connections: Moderately Integrated (03/29/2024)   Social Connection and Isolation Panel    Frequency of Communication with Friends and Family: More than three times a week    Frequency of Social Gatherings with Friends and Family: Twice a week    Attends Religious Services: More than 4 times per year    Active Member of [employer] or Organizations: No    Attends Banker Meetings: Not on file     Marital Status: Living with partner    No outpatient medications prior to visit.   No facility-administered medications prior to visit.     EXAM:  BP 110/70 (BP Location: Left Arm, Patient Position: Sitting, Cuff Size: Large)   Pulse 64   Temp 98 F (36.7 C) (Oral)   Ht 5' 5.1 (1.654 m)   Wt 170 lb 6.4 oz (77.3 kg)   SpO2 98%   BMI 28.27 kg/m   Body mass index is 28.27 kg/m. Wt Readings from Last 3 Encounters:  04/02/24 170 lb 6.4 oz (77.3 kg)  11/30/23 174 lb (78.9 kg)  03/29/23 168 lb 9.6 oz (76.5 kg)    Physical Exam: Vital signs reviewed HZW:Uypd is a well-developed well-nourished alert cooperative    who appearsr stated age in no acute distress.  HEENT: normocephalic atraumatic , Eyes: PERRL EOM's full, conjunctiva clear, Nares: paten,t no deformity discharge or tenderness., Ears: no deformity EAC's clear TMs with normal landmarks. Mouth: clear OP, no lesions, edema.  Moist mucous membranes. Dentition in adequate repair. NECK: supple without masses, thyromegaly or bruits. CHEST/PULM:  Clear to auscultation and percussion breath sounds equal no wheeze , rales or rhonchi. No chest wall deformities or tenderness. CV: PMI is nondisplaced, S1 S2 no gallops, murmurs, rubs. Peripheral pulses are full without delay.No JVD .  ABDOMEN: Bowel sounds normal nontender  No guard or rebound, no hepato splenomegal no CVA tenderness.   Extremtities:  No clubbing cyanosis or edema, no acute joint swelling or redness no focal atrophy  NEURO:  Oriented x3, cranial nerves 3-12 appear to be intact, no obvious focal weakness,gait within normal limits no abnormal reflexes or asymmetrical SKIN: No acute rashes normal turgor, color, no bruising or petechiae. PSYCH: Oriented, good eye contact, no obvious depression anxiety, cognition and judgment appear normal. LN: no cervical axillaryadenopathy  Lab Results  Component Value Date   WBC 6.6 03/29/2023   HGB 15.4 03/29/2023   HCT 44.6  03/29/2023   PLT 258.0 03/29/2023   GLUCOSE 88 03/29/2023   CHOL 246 (H) 03/29/2023   TRIG 232.0 (H) 03/29/2023   HDL 41.00 03/29/2023   LDLDIRECT 157.0 03/29/2023   LDLCALC 117 (H) 04/13/2021   ALT 75 (H) 03/29/2023   AST 36 03/29/2023   NA 139 03/29/2023   K 3.9 03/29/2023   CL 101 03/29/2023   CREATININE 1.04 03/29/2023   BUN 12 03/29/2023   CO2 29 03/29/2023   TSH 0.86 03/29/2023    BP Readings from Last 3 Encounters:  04/02/24 110/70  11/30/23 104/60  03/29/23 110/82    Lab orders are in the system  needs fu  nf today and want to come back fasting   ASSESSMENT AND PLAN:  Discussed the following assessment and plan:    ICD-10-CM   1. Visit for preventive health examination  Z00.00     2. Family history of diabetes mellitus (DM)  Z83.3     3. Elevated ALT measurement  R74.01      Had elevated alt last check needs fu  make fasting lab appt    Tracking    then decide if wants to do a dietary nutrient referral  General counsel today . About  avoiding processed foods carbs and snacks  Utd on vaccines No follow-ups on file.  Patient Care Team: Blakely Gluth K, MD as PCP - General Patient Instructions  Tracking  for 2 weeks or more  Optimize life style  Get fasting labs   Contact us  if  you wish to get a dietitian/ nutrition  consult.  Fu depending on labs  and yearly      Tyreck Bell K. Dryden Tapley M.D.

## 2024-04-02 NOTE — Patient Instructions (Signed)
 Tracking  for 2 weeks or more  Optimize life style  Get fasting labs   Contact us  if  you wish to get a dietitian/ nutrition  consult.  Fu depending on labs  and yearly

## 2024-04-23 ENCOUNTER — Other Ambulatory Visit (INDEPENDENT_AMBULATORY_CARE_PROVIDER_SITE_OTHER)

## 2024-04-23 ENCOUNTER — Ambulatory Visit: Payer: Self-pay | Admitting: Internal Medicine

## 2024-04-23 DIAGNOSIS — Z0000 Encounter for general adult medical examination without abnormal findings: Secondary | ICD-10-CM

## 2024-04-23 DIAGNOSIS — R7989 Other specified abnormal findings of blood chemistry: Secondary | ICD-10-CM

## 2024-04-23 DIAGNOSIS — M25562 Pain in left knee: Secondary | ICD-10-CM | POA: Diagnosis not present

## 2024-04-23 DIAGNOSIS — E7800 Pure hypercholesterolemia, unspecified: Secondary | ICD-10-CM

## 2024-04-23 LAB — CBC WITH DIFFERENTIAL/PLATELET
Basophils Absolute: 0 K/uL (ref 0.0–0.1)
Basophils Relative: 0.4 % (ref 0.0–3.0)
Eosinophils Absolute: 0.2 K/uL (ref 0.0–0.7)
Eosinophils Relative: 4.5 % (ref 0.0–5.0)
HCT: 42.6 % (ref 39.0–52.0)
Hemoglobin: 14.7 g/dL (ref 13.0–17.0)
Lymphocytes Relative: 52.7 % — ABNORMAL HIGH (ref 12.0–46.0)
Lymphs Abs: 2.8 K/uL (ref 0.7–4.0)
MCHC: 34.5 g/dL (ref 30.0–36.0)
MCV: 87.3 fl (ref 78.0–100.0)
Monocytes Absolute: 0.3 K/uL (ref 0.1–1.0)
Monocytes Relative: 6 % (ref 3.0–12.0)
Neutro Abs: 1.9 K/uL (ref 1.4–7.7)
Neutrophils Relative %: 36.4 % — ABNORMAL LOW (ref 43.0–77.0)
Platelets: 232 K/uL (ref 150.0–400.0)
RBC: 4.88 Mil/uL (ref 4.22–5.81)
RDW: 13.1 % (ref 11.5–15.5)
WBC: 5.3 K/uL (ref 4.0–10.5)

## 2024-04-23 LAB — HEPATIC FUNCTION PANEL
ALT: 118 U/L — ABNORMAL HIGH (ref 0–53)
AST: 48 U/L — ABNORMAL HIGH (ref 0–37)
Albumin: 4.5 g/dL (ref 3.5–5.2)
Alkaline Phosphatase: 76 U/L (ref 39–117)
Bilirubin, Direct: 0.1 mg/dL (ref 0.0–0.3)
Total Bilirubin: 0.5 mg/dL (ref 0.2–1.2)
Total Protein: 7.4 g/dL (ref 6.0–8.3)

## 2024-04-23 LAB — BASIC METABOLIC PANEL WITH GFR
BUN: 12 mg/dL (ref 6–23)
CO2: 30 meq/L (ref 19–32)
Calcium: 9.1 mg/dL (ref 8.4–10.5)
Chloride: 101 meq/L (ref 96–112)
Creatinine, Ser: 0.99 mg/dL (ref 0.40–1.50)
GFR: 107.18 mL/min (ref >60.00)
Glucose, Bld: 90 mg/dL (ref 70–99)
Potassium: 4 meq/L (ref 3.5–5.1)
Sodium: 141 meq/L (ref 135–145)

## 2024-04-23 LAB — LIPID PANEL
Cholesterol: 192 mg/dL (ref 0–200)
HDL: 28.4 mg/dL — ABNORMAL LOW (ref >39.00)
LDL Cholesterol: 100 mg/dL — ABNORMAL HIGH (ref 0–99)
NonHDL: 163.43
Total CHOL/HDL Ratio: 7
Triglycerides: 318 mg/dL — ABNORMAL HIGH (ref 0.0–149.0)
VLDL: 63.6 mg/dL — ABNORMAL HIGH (ref 0.0–40.0)

## 2024-04-23 LAB — TSH: TSH: 1.5 u[IU]/mL (ref 0.35–5.50)

## 2024-04-23 LAB — HEMOGLOBIN A1C: Hgb A1c MFr Bld: 5.3 % (ref 4.6–6.5)

## 2024-04-23 NOTE — Progress Notes (Signed)
 Low hdl and high triglycerides are not heart healthy . No diabetes  Liver tests are  abnormal .. could be from fatty inflammation of the liver but not sure . Please  stop all supplements in case  they are contributing  PLease arrange  repeat Lfts with hep c  aby hep b ag and hep b aby  in 1 month ( dont have to fast)  If still abnormal we will order an ultrasound  of your liver  May we refer you to a dietician /nutrition counseling about the cholesterol levels  ?  We should  do a fu lipid panel after  4-6 months of    lifestyle changes   and go from there .

## 2024-04-27 LAB — LIPOPROTEIN A (LPA): Lipoprotein (a): <10 nmol/L (ref <75)

## 2024-07-09 ENCOUNTER — Telehealth: Admitting: Internal Medicine

## 2024-07-09 VITALS — Ht 65.1 in | Wt 167.0 lb

## 2024-07-09 DIAGNOSIS — Z833 Family history of diabetes mellitus: Secondary | ICD-10-CM | POA: Diagnosis not present

## 2024-07-09 DIAGNOSIS — Z719 Counseling, unspecified: Secondary | ICD-10-CM | POA: Diagnosis not present

## 2024-07-09 DIAGNOSIS — R7989 Other specified abnormal findings of blood chemistry: Secondary | ICD-10-CM | POA: Diagnosis not present

## 2024-07-09 NOTE — Patient Instructions (Signed)
 Can send message   about fu partner evaluation and go from there. Get  labs done as discussed .

## 2024-07-09 NOTE — Progress Notes (Signed)
 Virtual Visit via Video Note  I connected with Curtis Frost on 07/09/24 at  8:30 AM EST by a video enabled telemedicine application and verified that I am speaking with the correct person using two identifiers. Location patient: home Location provider:work office Persons participating in the virtual visit: patient, provider   Patient aware  of the limitations of evaluation and management by telemedicine and  availability of in person appointments. and agreed to proceed.   HPI: Curtis Frost presents for video visit  .    Has ? About  partner and he  has no sx and moniogomas no other partners    noted recnetly  she felt increase discharge but no pain other sx   He has no other partner dysuria  or other sx .   ROS: See pertinent positives and negatives per HPI.  Past Medical History:  Diagnosis Date   Epistaxis 04/20/2010   Qualifier: Diagnosis of  By: Charlett MD, Apolinar POUR     History reviewed. No pertinent surgical history.  Family History  Problem Relation Age of Onset   Diabetes Other        both grandparents generations   Hyperlipidemia Mother        rx with diet    Social History   Tobacco Use   Smoking status: Never   Smokeless tobacco: Never  Vaping Use   Vaping status: Never Used  Substance Use Topics   Alcohol use: No   Drug use: No     No current outpatient medications on file.  EXAM: BP Readings from Last 3 Encounters:  04/02/24 110/70  11/30/23 104/60  03/29/23 110/82    VITALS per patient if applicable:  GENERAL: alert, oriented, appears well and in no acute distress  HEENT: atraumatic, conjunttiva clear, no obvious abnormalities on inspection of external nose and ears  NECK: normal movements of the head and neck  LUNGS: on inspection no signs of respiratory distress, breathing rate appears normal, no obvious gross SOB, gasping or wheezing  CV: no obvious cyanosis  MS: moves all visible extremities without noticeable  abnormality  PSYCH/NEURO: pleasant and cooperative, no obvious depression or anxiety, speech and thought processing grossly intact Lab Results  Component Value Date   WBC 5.3 04/23/2024   HGB 14.7 04/23/2024   HCT 42.6 04/23/2024   PLT 232.0 04/23/2024   GLUCOSE 90 04/23/2024   CHOL 192 04/23/2024   TRIG 318.0 (H) 04/23/2024   HDL 28.40 (L) 04/23/2024   LDLDIRECT 157.0 03/29/2023   LDLCALC 100 (H) 04/23/2024   ALT 118 (H) 04/23/2024   AST 48 (H) 04/23/2024   NA 141 04/23/2024   K 4.0 04/23/2024   CL 101 04/23/2024   CREATININE 0.99 04/23/2024   BUN 12 04/23/2024   CO2 30 04/23/2024   TSH 1.50 04/23/2024   HGBA1C 5.3 04/23/2024    ASSESSMENT AND PLAN:  Discussed the following assessment and plan:    ICD-10-CM   1. Encounter for counseling  Z71.9     2. Abnormal LFTs  R79.89     3. Family history of diabetes mellitus (DM)  Z83.3       Counseled.   Partner to be evaluated  doesn't seem like stii risk by hx .  Can always be screened but checking her fist wil help direction to go .   Disc last labs  and need for fu  not currently taking supplements off creatine   will plan fu labs as  in system and fu  attention to diet  continue healthy exercise .   He may have metabolic predisposition  for  dm  .  With low hdl and high tg   If lfts remain elevated consider further fu  abd u s  labs etc.   Expectant management and discussion of plan and treatment with opportunity to ask questions and all were answered. The patient agreed with the plan and demonstrated an understanding of the instructions.   Advised to call back or seek an in-person evaluation if worsening  or having  further concerns  in interim. No follow-ups on file.   Apolinar Eastern, MD

## 2025-04-03 ENCOUNTER — Encounter: Admitting: Internal Medicine
